# Patient Record
Sex: Male | Born: 1965
Health system: Southern US, Community
[De-identification: ages and names within clinical notes are randomized; demographics above are authoritative.]

## PROBLEM LIST (undated history)

## (undated) DIAGNOSIS — E785 Hyperlipidemia, unspecified: Secondary | ICD-10-CM

## (undated) DIAGNOSIS — I2510 Atherosclerotic heart disease of native coronary artery without angina pectoris: Secondary | ICD-10-CM

## (undated) DIAGNOSIS — I10 Essential (primary) hypertension: Secondary | ICD-10-CM

## (undated) HISTORY — DX: Atherosclerotic heart disease of native coronary artery without angina pectoris: I25.10

## (undated) HISTORY — DX: Hyperlipidemia, unspecified: E78.5

## (undated) HISTORY — DX: Essential (primary) hypertension: I10

---

## 2005-07-28 ENCOUNTER — Encounter: Admission: RE | Admit: 2005-07-28 | Discharge: 2005-07-28 | Payer: Self-pay | Admitting: Internal Medicine

## 2007-09-29 IMAGING — CT CT ANGIO ABDOMEN
1 of 9 series · 13 of 32 positions shown, 19 images · IV contrast (omnipaque)
Comparison: None.

CLINICAL DATA: 39-year-old male, benign refractory renal vascular hypertension despite medications.  
CT ANGIOGRAPHY OF ABDOMEN:
TECHNIQUE: Multidetector CT imaging of the abdomen was performed during bolus injection of intravenous contrast.  Multiplanar CT angiographic image reconstructions were generated to evaluate the vascular anatomy.
Contrast:  125 cc Omnipaque 350

[Series 5: recon 2: renal/sma cta · axial · 0.70mm/px · z∈[-291,-19]mm · 13 of 509 slices shown, 19 images]
[im 37/509  soft-tissue]
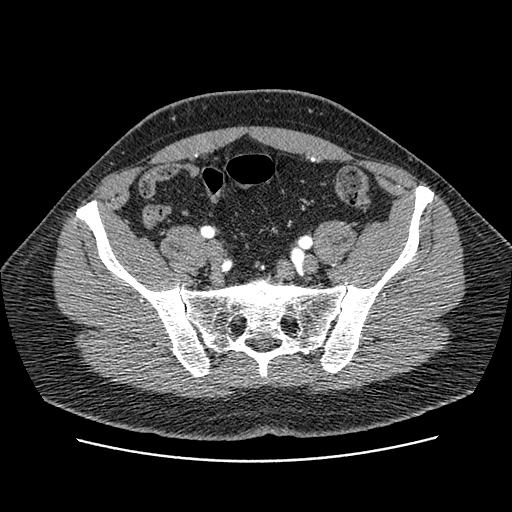
[im 37/509  bone]
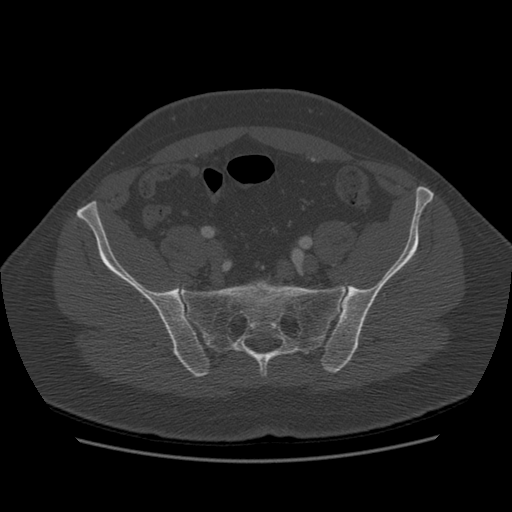
[im 73/509  soft-tissue]
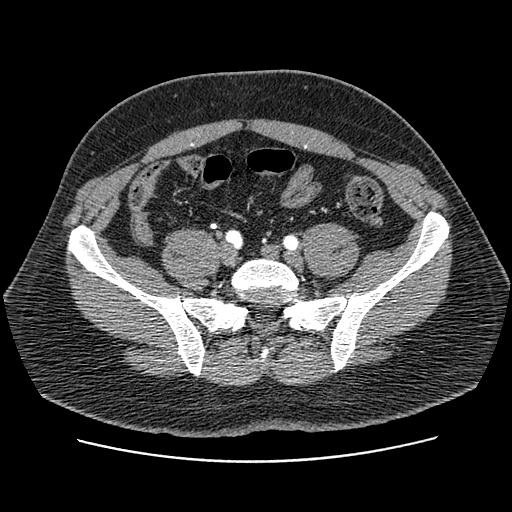
[im 109/509  soft-tissue]
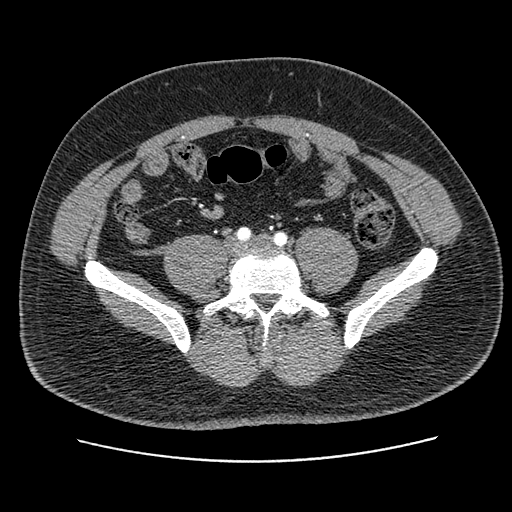
[im 146/509  soft-tissue]
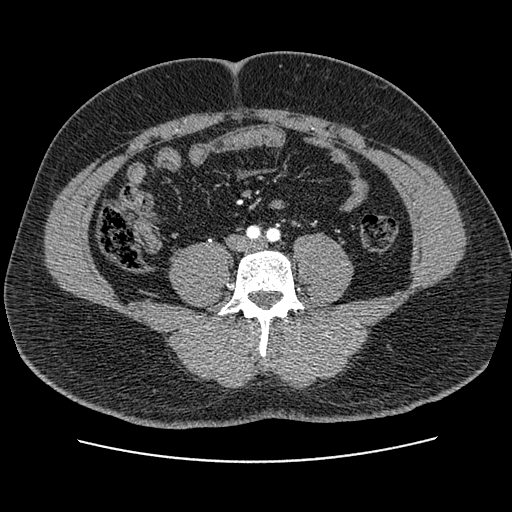
[im 182/509  soft-tissue]
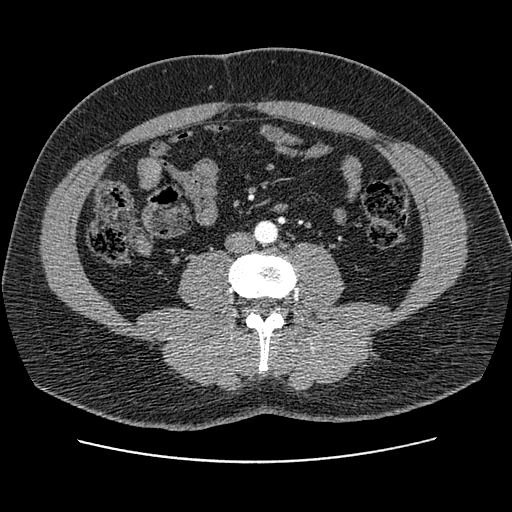
[im 218/509  soft-tissue]
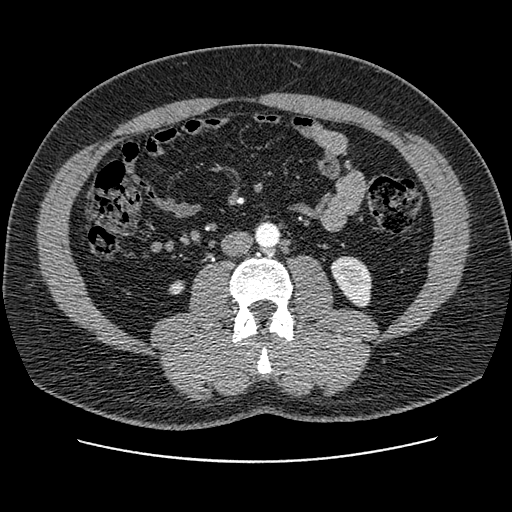
[im 255/509  soft-tissue]
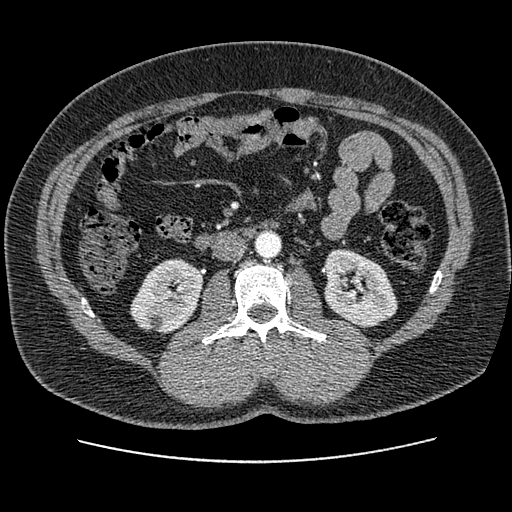
[im 291/509  soft-tissue]
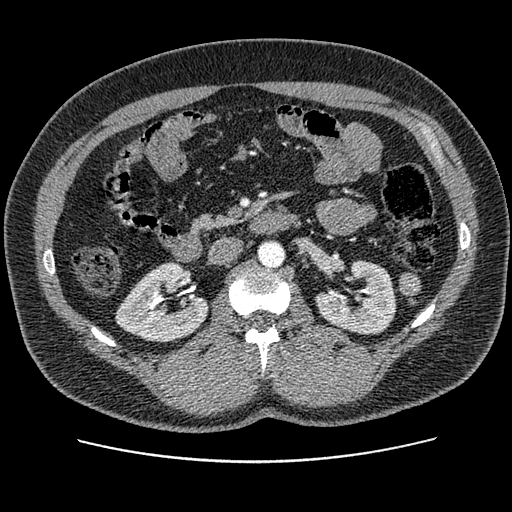
[im 327/509  soft-tissue]
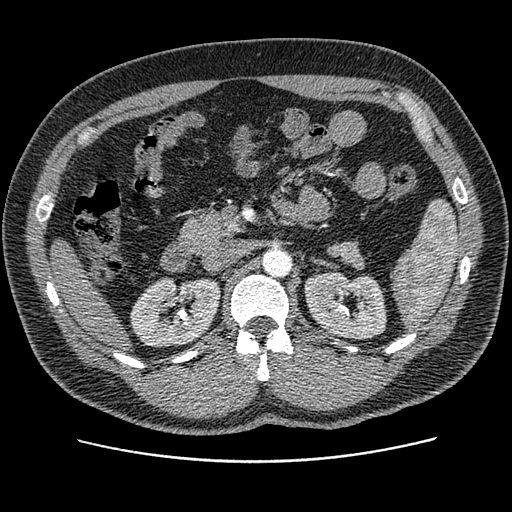
[im 327/509  bone]
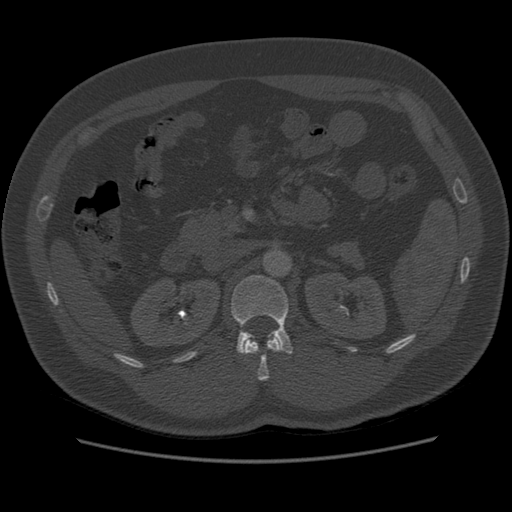
[im 363/509  soft-tissue]
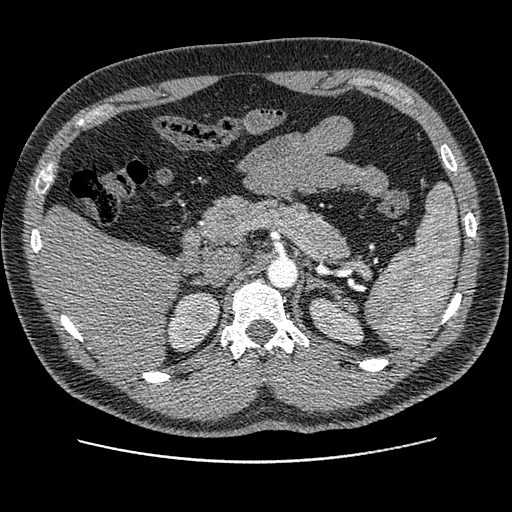
[im 363/509  lung]
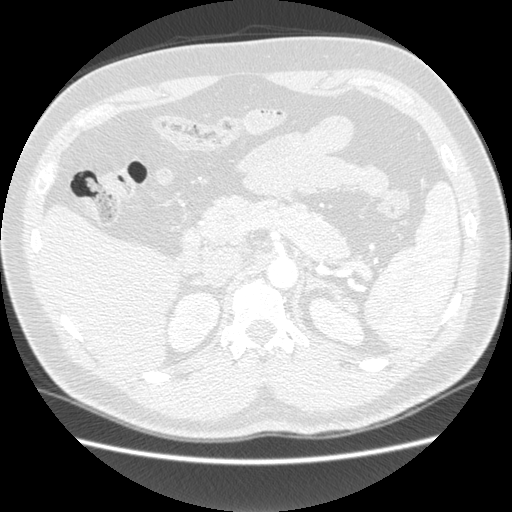
[im 400/509  soft-tissue]
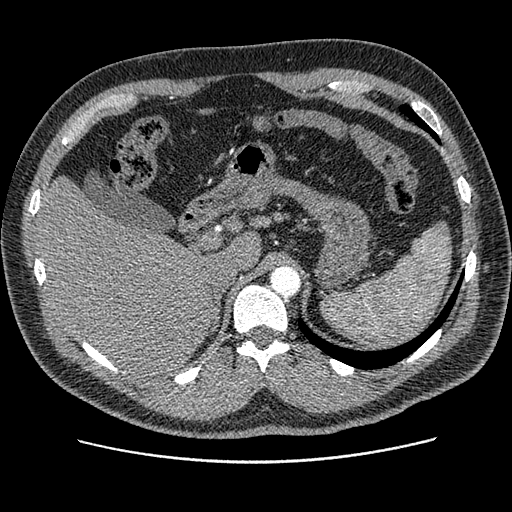
[im 400/509  lung]
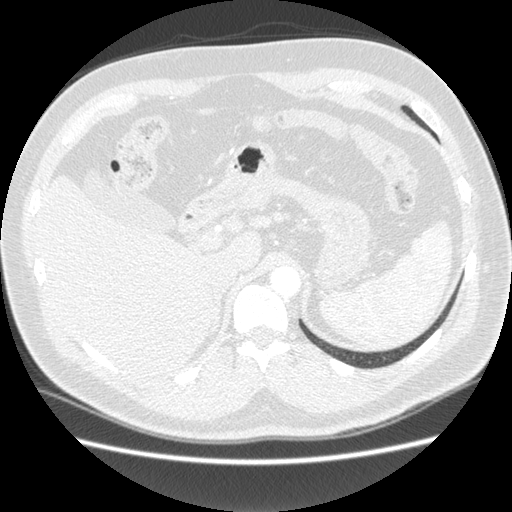
[im 436/509  soft-tissue]
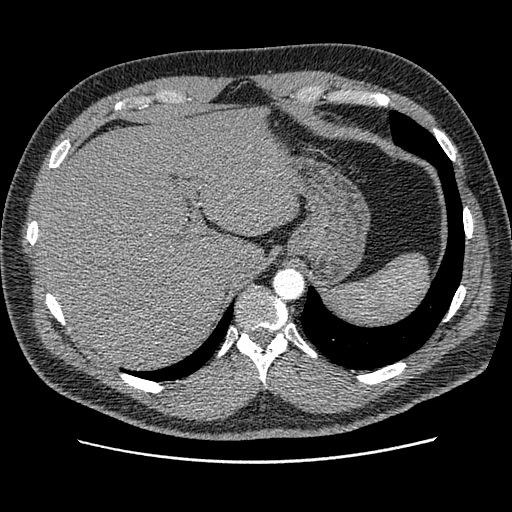
[im 436/509  lung]
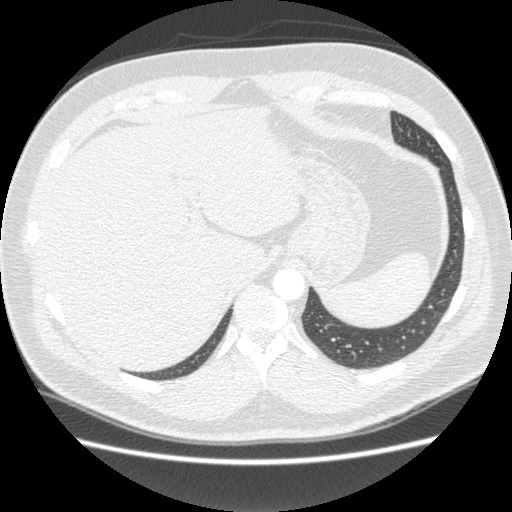
[im 472/509  soft-tissue]
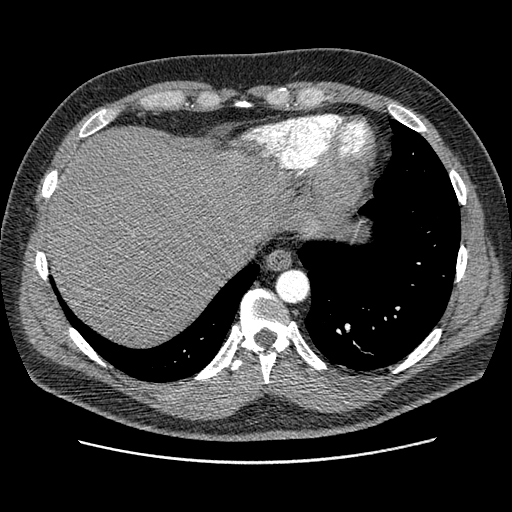
[im 472/509  lung]
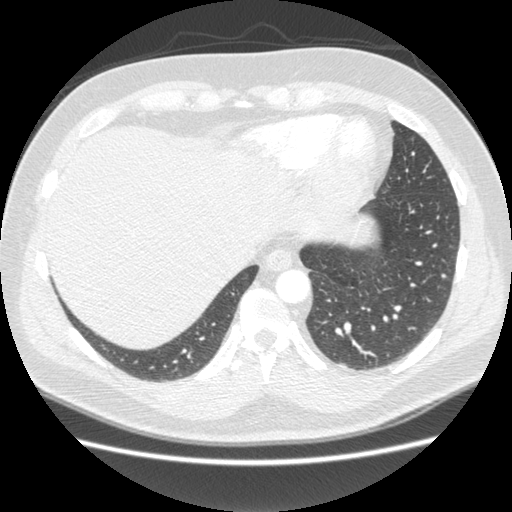

[13 of 32 positions shown; findings below may reference images not displayed]

FINDINGS: Minimal wall irregularity of the aorta without calcification, dissection, aneurysm, or occlusive disease.  There are single widely patent renal arteries.  No evidence of proximal or ostial renal artery stenosis by CTA.  The SMA and IMA are patent.  The visualized jejunal, colic, and ileocolic branches of the SMA are patent throughout the mesentery.  The celiac artery orgin has a normal variant.  The hepatic artery has a separate origin off of the aorta which is patent.  The main celiac axis supplying the splenic and left gastric is also patent.  No visceral vascular disease.  No accessory renal vasculature.
Included axial imaging of the abdomen demonstrates several left upper quadrant perisplenic nodules consistent with accessory splenules (4).  Tiny right renal cysts are evident less than 1 cm in size. 
Kidneys demonstrate no hydronephrosis, acute inflammation, stone disease, or fluid collection.  The kidneys are symmetric in size with preserved cortex.
IMPRESSION: 1.  Widely patent single renal arteries.  No evidence of renal artery stenosis by CTA.  
2.  Patent visceral vasculature. 
3.  No aortoiliac occlusive disease.

## 2010-11-03 ENCOUNTER — Ambulatory Visit (HOSPITAL_COMMUNITY)
Admission: EM | Admit: 2010-11-03 | Discharge: 2010-11-04 | Payer: 59 | Attending: Emergency Medicine | Admitting: Emergency Medicine

## 2010-11-03 DIAGNOSIS — T18108A Unspecified foreign body in esophagus causing other injury, initial encounter: Secondary | ICD-10-CM | POA: Insufficient documentation

## 2010-11-03 DIAGNOSIS — K229 Disease of esophagus, unspecified: Secondary | ICD-10-CM | POA: Insufficient documentation

## 2010-11-03 DIAGNOSIS — IMO0002 Reserved for concepts with insufficient information to code with codable children: Secondary | ICD-10-CM | POA: Insufficient documentation

## 2010-11-04 ENCOUNTER — Other Ambulatory Visit: Payer: Self-pay | Admitting: Gastroenterology

## 2012-08-28 ENCOUNTER — Other Ambulatory Visit: Payer: Self-pay | Admitting: Internal Medicine

## 2012-08-28 DIAGNOSIS — IMO0002 Reserved for concepts with insufficient information to code with codable children: Secondary | ICD-10-CM

## 2012-09-03 ENCOUNTER — Ambulatory Visit
Admission: RE | Admit: 2012-09-03 | Discharge: 2012-09-03 | Disposition: A | Discharge status: Home / Self Care | Payer: 59 | Source: Ambulatory Visit | Attending: Internal Medicine | Admitting: Internal Medicine

## 2012-09-03 DIAGNOSIS — IMO0002 Reserved for concepts with insufficient information to code with codable children: Secondary | ICD-10-CM

## 2013-12-19 ENCOUNTER — Encounter (HOSPITAL_COMMUNITY): Payer: Self-pay

## 2013-12-19 ENCOUNTER — Ambulatory Visit (HOSPITAL_COMMUNITY)
Admission: RE | Admit: 2013-12-19 | Discharge: 2013-12-19 | Disposition: A | Discharge status: Home / Self Care | Payer: 59 | Source: Ambulatory Visit | Attending: Gastroenterology | Admitting: Gastroenterology

## 2013-12-19 ENCOUNTER — Encounter (HOSPITAL_COMMUNITY)
Admission: RE | Disposition: A | Discharge status: Home / Self Care | Payer: Self-pay | Source: Ambulatory Visit | Attending: Gastroenterology

## 2013-12-19 DIAGNOSIS — IMO0002 Reserved for concepts with insufficient information to code with codable children: Secondary | ICD-10-CM | POA: Diagnosis not present

## 2013-12-19 DIAGNOSIS — T18108A Unspecified foreign body in esophagus causing other injury, initial encounter: Secondary | ICD-10-CM | POA: Diagnosis present

## 2013-12-19 DIAGNOSIS — K200 Eosinophilic esophagitis: Secondary | ICD-10-CM | POA: Diagnosis not present

## 2013-12-19 HISTORY — PX: ESOPHAGOGASTRODUODENOSCOPY: SHX5428

## 2013-12-19 SURGERY — EGD (ESOPHAGOGASTRODUODENOSCOPY)
Anesthesia: Moderate Sedation

## 2013-12-19 MED ORDER — MIDAZOLAM HCL 5 MG/ML IJ SOLN
INTRAMUSCULAR | Status: AC
  Filled 2013-12-19: qty 2

## 2013-12-19 MED ORDER — BUTAMBEN-TETRACAINE-BENZOCAINE 2-2-14 % EX AERO
INHALATION_SPRAY | CUTANEOUS | Status: DC | PRN
  Administered 2013-12-19: 2 via TOPICAL

## 2013-12-19 MED ORDER — SODIUM CHLORIDE 0.9 % IV SOLN
INTRAVENOUS | Status: DC
  Administered 2013-12-19: 500 mL via INTRAVENOUS

## 2013-12-19 MED ORDER — FENTANYL CITRATE 0.05 MG/ML IJ SOLN
INTRAMUSCULAR | Status: AC
  Filled 2013-12-19: qty 2

## 2013-12-19 MED ORDER — MIDAZOLAM HCL 10 MG/2ML IJ SOLN
INTRAMUSCULAR | Status: DC | PRN
  Administered 2013-12-19 (×3): 2 mg via INTRAVENOUS

## 2013-12-19 MED ORDER — FENTANYL CITRATE 0.05 MG/ML IJ SOLN
INTRAMUSCULAR | Status: DC | PRN
  Administered 2013-12-19 (×3): 25 ug via INTRAVENOUS

## 2013-12-19 NOTE — Op Note (Signed)
[facility] [address] [place], [id number]   OPERATIVE PROCEDURE REPORT  PATIENT: [name], Frank R.  MR#: [id number] BIRTHDATE: 12-30-1965  GENDER: Male ENDOSCOPIST: Jeani HawkingPatrick Drucella Karbowski, MD ASSISTANT:   Kandice RobinsonsGuillaume Awaka, technician and Priscella MannAutumn Goldsmith, technician PROCEDURE DATE: 12/19/2013 PROCEDURE:   EGD, diagnostic ASA CLASS:   Class II INDICATIONS:Food impaction. MEDICATIONS: Versed 6 mg IV and Fentanyl 75 mcg IV TOPICAL ANESTHETIC:   Cetacaine Spray  DESCRIPTION OF PROCEDURE:   After the risks benefits and alternatives of the procedure were thoroughly explained, informed consent was obtained.  The Pentax Gastroscope [id number]  endoscope was introduced through the mouth  and advanced to the second portion of the duodenum Without limitations.      The instrument was slowly withdrawn as the mucosa was fully examined.      FINDINGS: The esophagus displayed concentric rings consistent with his diagnosis of EoE.  In the distla esophagus the meat bolus was identified.  Using gentle to moderate pressure, the meat bolus was able to be passed into the gastric lumen.  The Z-line was sharp. No other abnormalities noted in the upper GI tract.          The scope was then withdrawn from the patient and the procedure terminated.  COMPLICATIONS: There were no complications.  IMPRESSION: 1) Meat impaction secondary to EoE.  RECOMMENDATIONS: 1) PPI QD indefinitely. 2) Restart Fluticasone inhaler. 3) Try the 6 food elimination diet. 4) Follow up in the office in a month.  _______________________________ eSigned:  Jeani HawkingPatrick Sarkis Rhines, MD 12/19/2013 5:42 PM

## 2013-12-19 NOTE — Discharge Instructions (Signed)
Conscious Sedation, Adult, Care After Refer to this sheet in the next few weeks. These instructions provide you with information on caring for yourself after your procedure. Your health care provider may also give you more specific instructions. Your treatment has been planned according to current medical practices, but problems sometimes occur. Call your health care provider if you have any problems or questions after your procedure. WHAT TO EXPECT AFTER THE PROCEDURE  After your procedure:  You may feel sleepy, clumsy, and have poor balance for several hours.  Vomiting may occur if you eat too soon after the procedure. HOME CARE INSTRUCTIONS  Do not participate in any activities where you could become injured for at least 24 hours. Do not:  Drive.  Swim.  Ride a bicycle.  Operate heavy machinery.  Cook.  Use power tools.  Climb ladders.  Work from a high place.  Do not make important decisions or sign legal documents until you are improved.  If you vomit, drink water, juice, or soup when you can drink without vomiting. Make sure you have little or no nausea before eating solid foods.  Only take over-the-counter or prescription medicines for pain, discomfort, or fever as directed by your health care provider.  Make sure you and your family fully understand everything about the medicines given to you, including what side effects may occur.  You should not drink alcohol, take sleeping pills, or take medicines that cause drowsiness for at least 24 hours.  If you smoke, do not smoke without supervision.  If you are feeling better, you may resume normal activities 24 hours after you were sedated.  Keep all appointments with your health care provider. SEEK MEDICAL CARE IF:  Your skin is pale or bluish in color.  You continue to feel nauseous or vomit.  Your pain is getting worse and is not helped by medicine.  You have bleeding or swelling.  You are still sleepy or  feeling clumsy after 24 hours. SEEK IMMEDIATE MEDICAL CARE IF:  You develop a rash.  You have difficulty breathing.  You develop any type of allergic problem.  You have a fever. MAKE SURE YOU:  Understand these instructions.  Will watch your condition.  Will get help right away if you are not doing well or get worse. Document Released: 02/12/2013 Document Reviewed: 02/12/2013 [facility] Patient Information 2015 [place], [place]. This information is not intended to replace advice given to you by your health care provider. Make sure you discuss any questions you have with your health care provider. [facility] Care After  These instructions give you information on caring for yourself after your procedure. Your doctor may also give you more specific instructions. Call your doctor if you have any problems or questions after your procedure. HOME CARE  You may go back to your normal diet and activities when you feel able.  Drink enough fluids to keep your pee (urine) clear or pale yellow.  If you have a hard time pooping (constipation), ask your doctor if you should take a medicine to help you poop (laxative). GET HELP RIGHT AWAY IF:  You cannot pass gas.  You have a very hard time pooping.  You have belly (abdominal) pain that gets worse.  You have red, itchy spots (hives) on your skin.  Your throat gets puffy (swells).  You have trouble breathing.  You have a fever.  You have a hard time pooping for more than 3 days.  Your poop looks white or chalky  after 3 days.  You have cramps, pain, or watery poop (diarrhea).  You feel sick to your stomach (nauseous), or you throw up (vomit). MAKE SURE YOU:  Understand these instructions.  Will watch your condition.  Will get help right away if you are not doing well or get worse. Document Released: 07/17/2011 Document Reviewed: 07/17/2011 [facility] Patient Information 2015 [place], [place]. This information is  not intended to replace advice given to you by your health care provider. Make sure you discuss any questions you have with your health care provider.

## 2013-12-19 NOTE — H&P (Signed)
  Frank Watson HPI: This is a 48 year old male with a PMH of eosinophilic esophagitis and prior food impaction in 2012.  He presents with recurrent symptoms of a food impaction.  This afternoon, around 12 PM he ate a piece of chicken and it lodged in his esophagus.  He is able to manage his oral secretions to a point.  History reviewed. No pertinent past medical history.  History reviewed. No pertinent past surgical history.  History reviewed. No pertinent family history.  Social History:  reports that he has never smoked. He has never used smokeless tobacco. His alcohol and drug histories are not on file.  Allergies: Not on File  Medications:  Scheduled:  Continuous: . sodium chloride      No results found for this or any previous visit (from the past 24 hour(s)).   No results found.  ROS:  As stated above in the HPI otherwise negative.  Blood pressure 200/108, pulse 75, resp. rate 15, SpO2 98.00%.    PE: Gen: NAD, Alert and Oriented HEENT:  [place]/AT, EOMI Neck: Supple, no LAD Lungs: CTA Bilaterally CV: RRR without M/G/R ABM: Soft, NTND, +BS Ext: No C/C/E  Assessment/Plan: 1) Food impaction. 2) Eosinophilic esophagitis.  Plan: 1) EGD.  Frank Watson D 12/19/2013, 5:04 PM

## 2013-12-22 ENCOUNTER — Encounter (HOSPITAL_COMMUNITY): Payer: Self-pay | Admitting: Gastroenterology

## 2014-11-05 IMAGING — US US SCROTUM
1 series · 14 of 25 positions shown · non-contrast
Comparison: None.

CLINICAL DATA: Left testicular/scrotal mass inferiorly for 2 weeks.
Prior vasectomy.

ULTRASOUND OF SCROTUM
TECHNIQUE: Complete ultrasound examination of the testicles,
epididymis, and other scrotal structures was performed.

[Series 1: us scrotum · 0.06mm/px · 14 of 49 slices shown]
[im 1/49]
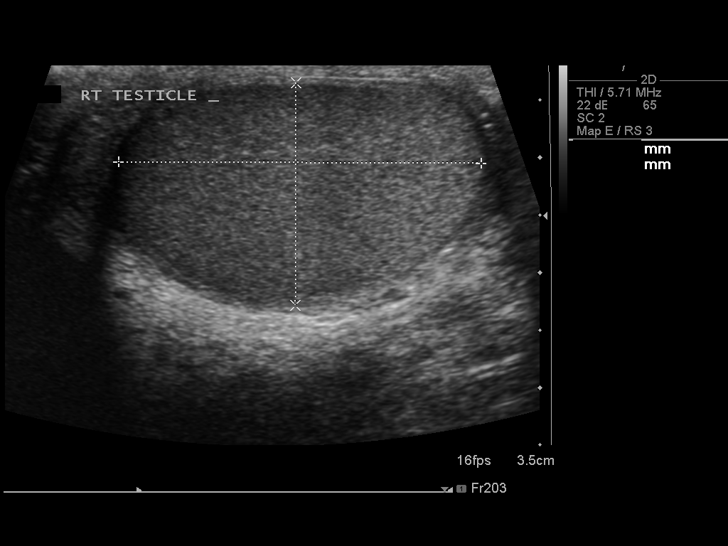
[im 5/49]
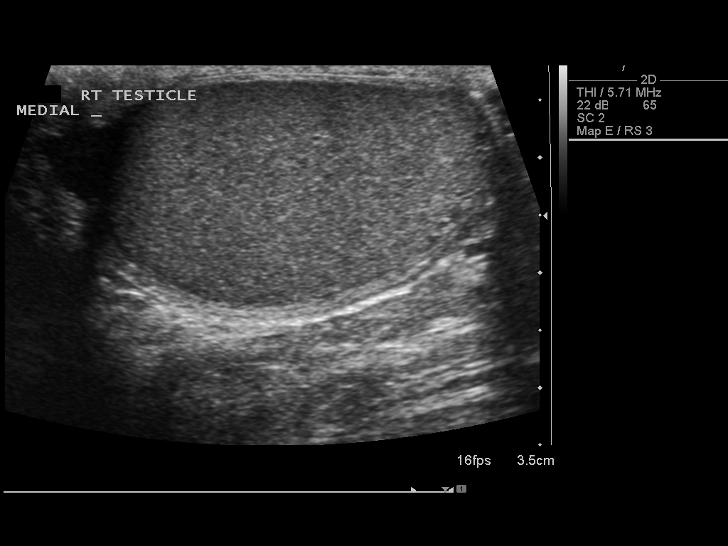
[im 9/49]
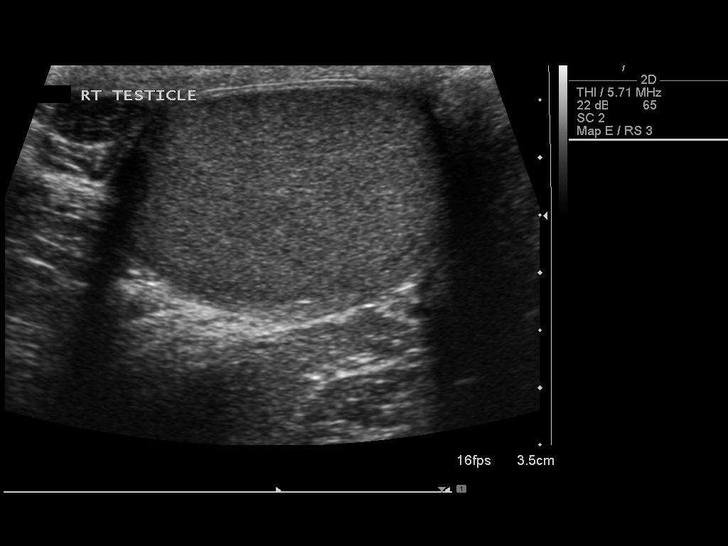
[im 13/49]
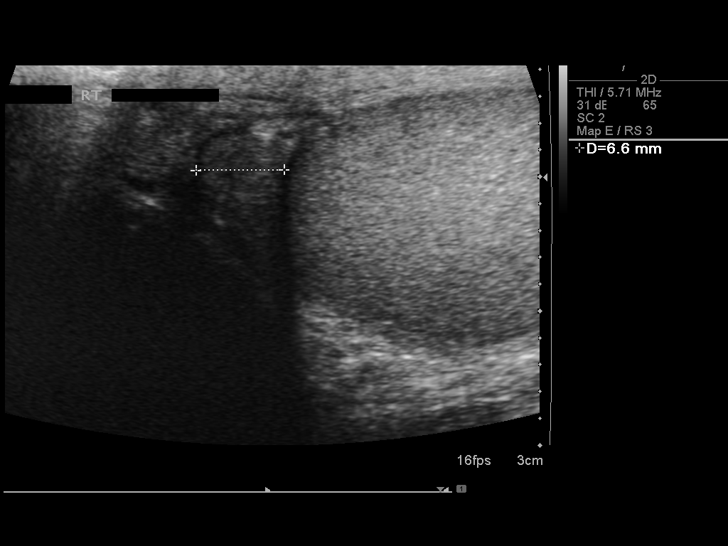
[im 17/49]
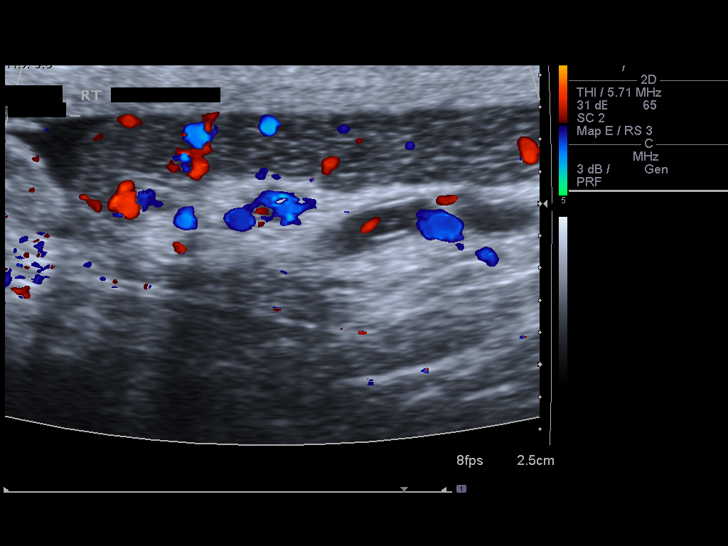
[im 19/49]
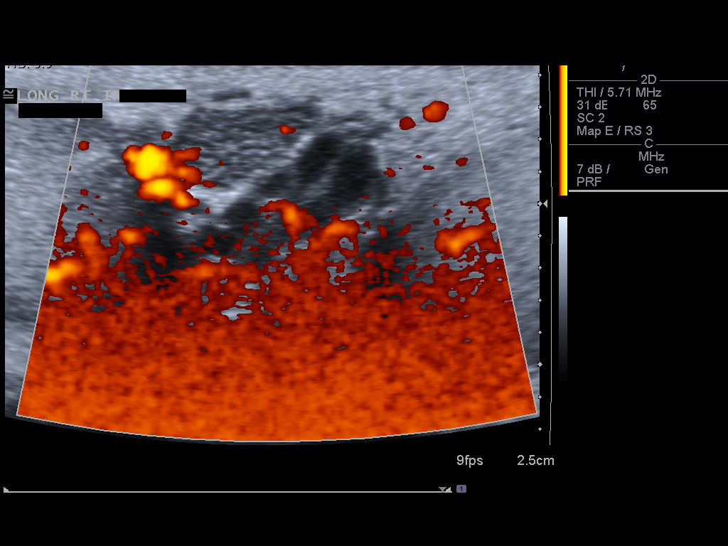
[im 23/49]
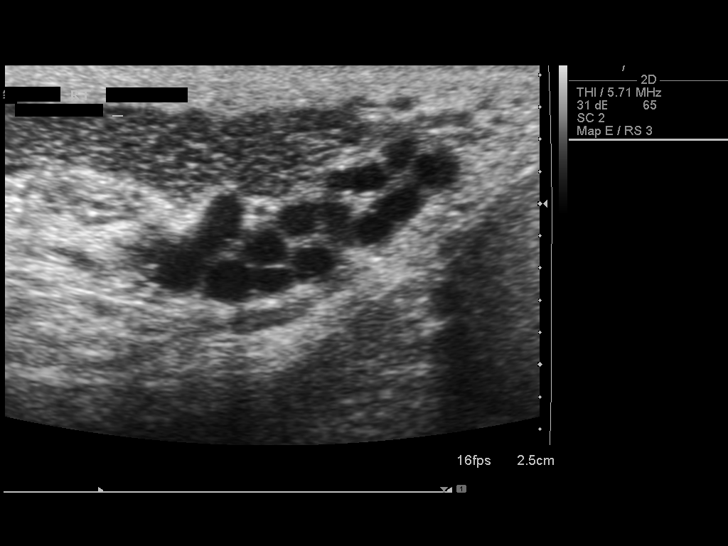
[im 27/49]
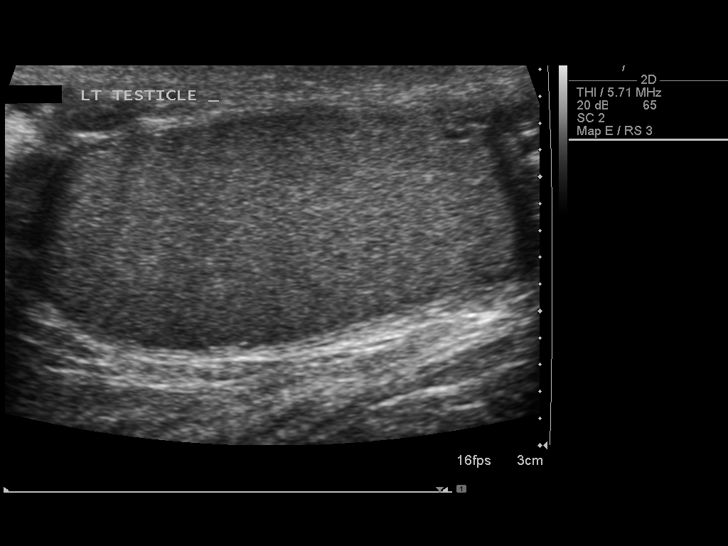
[im 31/49]
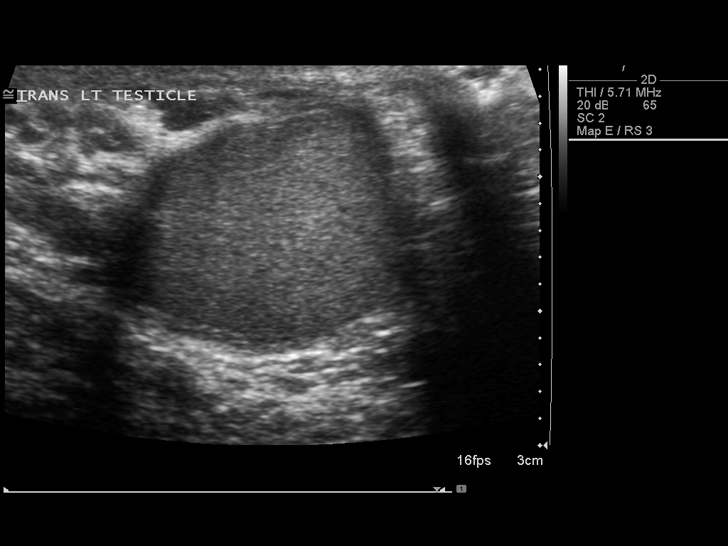
[im 33/49]
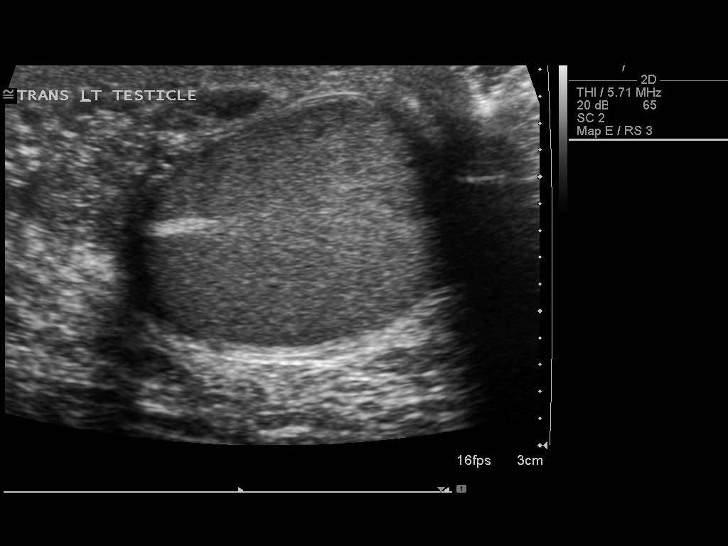
[im 37/49]
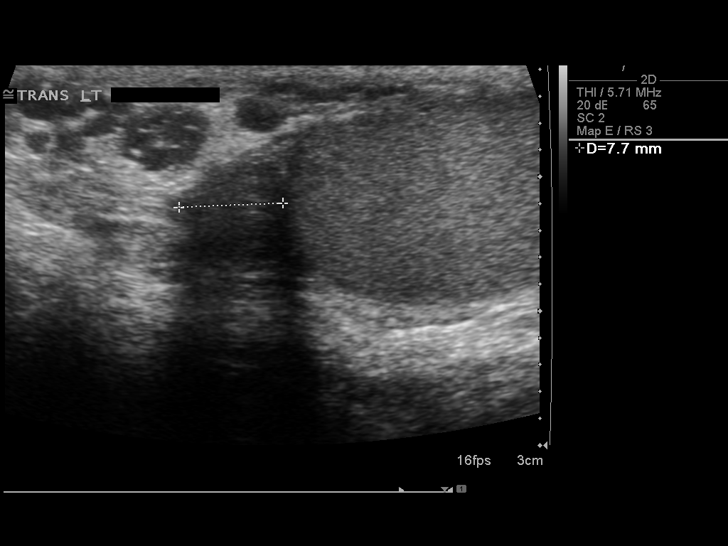
[im 41/49]
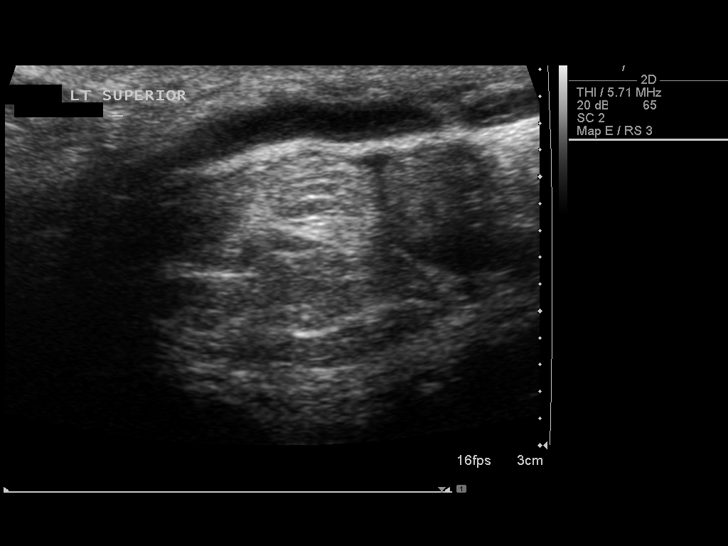
[im 45/49]
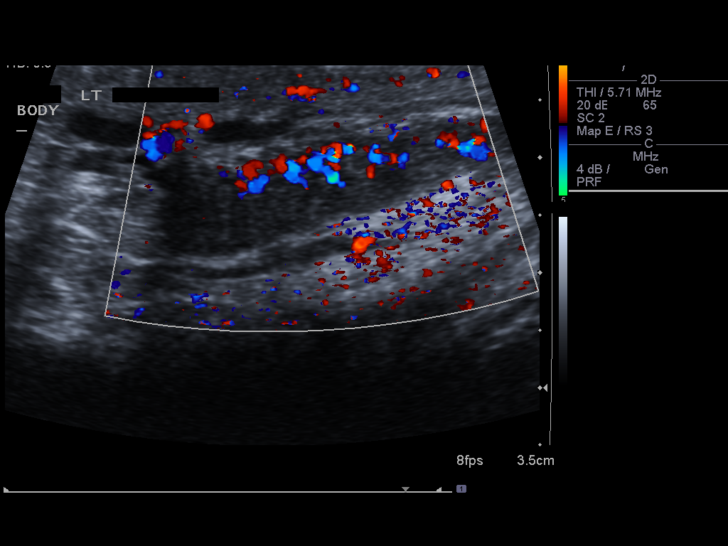
[im 49/49]
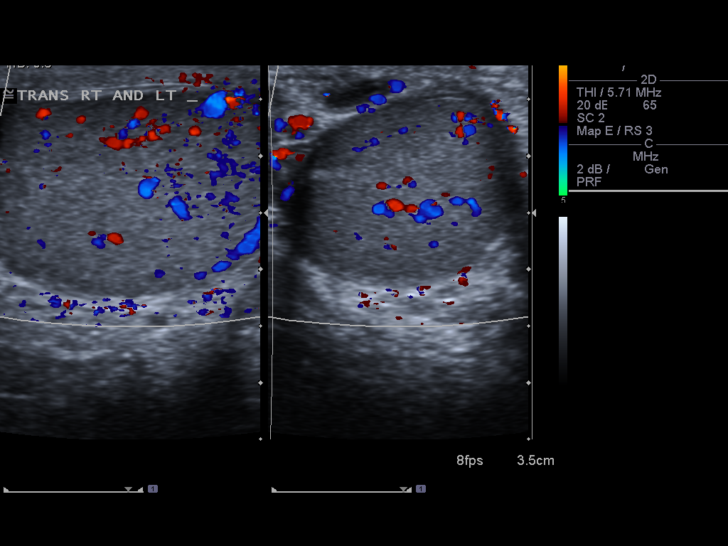

[14 of 25 positions shown; findings below may reference images not displayed]

FINDINGS: Right testis:  3.2 x 2.8 x 1.9 cm.  Normal.

Left testis:  3.7 x 2.3 x 1.7 cm.  Normal.

Right epididymis:  Normal in size and appearance.

Left epididymis:  The tail of the left epididymis is mildly
prominent with increased internal color flow but no measurable mass
lesion.

No intratesticular mass is seen on either side.  The testes are
equal in echogenicity with color flow present bilaterally.

Hydrocele:  Absent

Varicocele:  Small varicoceles are noted bilaterally.
IMPRESSION: No intratesticular mass lesion.  Prominence of the left epididymis
tail with hyperemia, which could represent focal epididymitis.  No
measurable mass lesion.

## 2015-11-24 DIAGNOSIS — I10 Essential (primary) hypertension: Secondary | ICD-10-CM | POA: Diagnosis not present

## 2015-11-24 DIAGNOSIS — R809 Proteinuria, unspecified: Secondary | ICD-10-CM | POA: Diagnosis not present

## 2015-11-24 DIAGNOSIS — Z8249 Family history of ischemic heart disease and other diseases of the circulatory system: Secondary | ICD-10-CM | POA: Diagnosis not present

## 2015-11-24 DIAGNOSIS — E784 Other hyperlipidemia: Secondary | ICD-10-CM | POA: Diagnosis not present

## 2016-01-04 DIAGNOSIS — L237 Allergic contact dermatitis due to plants, except food: Secondary | ICD-10-CM | POA: Diagnosis not present

## 2016-07-24 DIAGNOSIS — M109 Gout, unspecified: Secondary | ICD-10-CM | POA: Diagnosis not present

## 2016-07-24 DIAGNOSIS — I10 Essential (primary) hypertension: Secondary | ICD-10-CM | POA: Diagnosis not present

## 2016-07-24 DIAGNOSIS — E784 Other hyperlipidemia: Secondary | ICD-10-CM | POA: Diagnosis not present

## 2016-07-24 DIAGNOSIS — Z125 Encounter for screening for malignant neoplasm of prostate: Secondary | ICD-10-CM | POA: Diagnosis not present

## 2016-07-31 DIAGNOSIS — Z8249 Family history of ischemic heart disease and other diseases of the circulatory system: Secondary | ICD-10-CM | POA: Diagnosis not present

## 2016-07-31 DIAGNOSIS — Z23 Encounter for immunization: Secondary | ICD-10-CM | POA: Diagnosis not present

## 2016-07-31 DIAGNOSIS — I10 Essential (primary) hypertension: Secondary | ICD-10-CM | POA: Diagnosis not present

## 2016-07-31 DIAGNOSIS — E784 Other hyperlipidemia: Secondary | ICD-10-CM | POA: Diagnosis not present

## 2016-07-31 DIAGNOSIS — Z0000 Encounter for general adult medical examination without abnormal findings: Secondary | ICD-10-CM | POA: Diagnosis not present

## 2016-07-31 DIAGNOSIS — Z1389 Encounter for screening for other disorder: Secondary | ICD-10-CM | POA: Diagnosis not present

## 2016-07-31 DIAGNOSIS — K219 Gastro-esophageal reflux disease without esophagitis: Secondary | ICD-10-CM | POA: Diagnosis not present

## 2017-07-25 DIAGNOSIS — M109 Gout, unspecified: Secondary | ICD-10-CM | POA: Diagnosis not present

## 2017-07-25 DIAGNOSIS — I10 Essential (primary) hypertension: Secondary | ICD-10-CM | POA: Diagnosis not present

## 2017-07-25 DIAGNOSIS — Z0000 Encounter for general adult medical examination without abnormal findings: Secondary | ICD-10-CM | POA: Diagnosis not present

## 2017-07-25 DIAGNOSIS — Z125 Encounter for screening for malignant neoplasm of prostate: Secondary | ICD-10-CM | POA: Diagnosis not present

## 2017-08-01 DIAGNOSIS — Z1389 Encounter for screening for other disorder: Secondary | ICD-10-CM | POA: Diagnosis not present

## 2017-08-01 DIAGNOSIS — M109 Gout, unspecified: Secondary | ICD-10-CM | POA: Diagnosis not present

## 2017-08-01 DIAGNOSIS — E785 Hyperlipidemia, unspecified: Secondary | ICD-10-CM | POA: Diagnosis not present

## 2017-08-01 DIAGNOSIS — Z0000 Encounter for general adult medical examination without abnormal findings: Secondary | ICD-10-CM | POA: Diagnosis not present

## 2017-08-01 DIAGNOSIS — Z8249 Family history of ischemic heart disease and other diseases of the circulatory system: Secondary | ICD-10-CM | POA: Diagnosis not present

## 2017-08-01 DIAGNOSIS — I10 Essential (primary) hypertension: Secondary | ICD-10-CM | POA: Diagnosis not present

## 2018-01-16 DIAGNOSIS — Z1211 Encounter for screening for malignant neoplasm of colon: Secondary | ICD-10-CM | POA: Diagnosis not present

## 2018-01-16 DIAGNOSIS — D721 Eosinophilia: Secondary | ICD-10-CM | POA: Diagnosis not present

## 2018-01-29 DIAGNOSIS — D105 Benign neoplasm of other parts of oropharynx: Secondary | ICD-10-CM | POA: Diagnosis not present

## 2018-01-29 DIAGNOSIS — J343 Hypertrophy of nasal turbinates: Secondary | ICD-10-CM | POA: Diagnosis not present

## 2018-01-29 DIAGNOSIS — Z7289 Other problems related to lifestyle: Secondary | ICD-10-CM | POA: Diagnosis not present

## 2018-01-29 DIAGNOSIS — K200 Eosinophilic esophagitis: Secondary | ICD-10-CM | POA: Diagnosis not present

## 2018-03-20 DIAGNOSIS — K200 Eosinophilic esophagitis: Secondary | ICD-10-CM | POA: Diagnosis not present

## 2018-03-20 DIAGNOSIS — Z1211 Encounter for screening for malignant neoplasm of colon: Secondary | ICD-10-CM | POA: Diagnosis not present

## 2018-06-28 DIAGNOSIS — R1310 Dysphagia, unspecified: Secondary | ICD-10-CM | POA: Diagnosis not present

## 2018-08-01 DIAGNOSIS — M109 Gout, unspecified: Secondary | ICD-10-CM | POA: Diagnosis not present

## 2018-08-01 DIAGNOSIS — Z0000 Encounter for general adult medical examination without abnormal findings: Secondary | ICD-10-CM | POA: Diagnosis not present

## 2018-08-01 DIAGNOSIS — I10 Essential (primary) hypertension: Secondary | ICD-10-CM | POA: Diagnosis not present

## 2018-08-01 DIAGNOSIS — E7849 Other hyperlipidemia: Secondary | ICD-10-CM | POA: Diagnosis not present

## 2018-08-01 DIAGNOSIS — Z125 Encounter for screening for malignant neoplasm of prostate: Secondary | ICD-10-CM | POA: Diagnosis not present

## 2018-08-08 DIAGNOSIS — Z1331 Encounter for screening for depression: Secondary | ICD-10-CM | POA: Diagnosis not present

## 2018-08-08 DIAGNOSIS — E785 Hyperlipidemia, unspecified: Secondary | ICD-10-CM | POA: Diagnosis not present

## 2018-08-08 DIAGNOSIS — K219 Gastro-esophageal reflux disease without esophagitis: Secondary | ICD-10-CM | POA: Diagnosis not present

## 2018-08-08 DIAGNOSIS — Z8249 Family history of ischemic heart disease and other diseases of the circulatory system: Secondary | ICD-10-CM | POA: Diagnosis not present

## 2018-08-08 DIAGNOSIS — I10 Essential (primary) hypertension: Secondary | ICD-10-CM | POA: Diagnosis not present

## 2018-08-08 DIAGNOSIS — Z0000 Encounter for general adult medical examination without abnormal findings: Secondary | ICD-10-CM | POA: Diagnosis not present

## 2019-03-19 DIAGNOSIS — Z20828 Contact with and (suspected) exposure to other viral communicable diseases: Secondary | ICD-10-CM | POA: Diagnosis not present

## 2019-03-19 DIAGNOSIS — J3489 Other specified disorders of nose and nasal sinuses: Secondary | ICD-10-CM | POA: Diagnosis not present

## 2019-03-31 DIAGNOSIS — J3489 Other specified disorders of nose and nasal sinuses: Secondary | ICD-10-CM | POA: Diagnosis not present

## 2019-03-31 DIAGNOSIS — Z20828 Contact with and (suspected) exposure to other viral communicable diseases: Secondary | ICD-10-CM | POA: Diagnosis not present

## 2019-06-02 DIAGNOSIS — R05 Cough: Secondary | ICD-10-CM | POA: Diagnosis not present

## 2019-06-02 DIAGNOSIS — Z20828 Contact with and (suspected) exposure to other viral communicable diseases: Secondary | ICD-10-CM | POA: Diagnosis not present

## 2019-06-02 DIAGNOSIS — J3489 Other specified disorders of nose and nasal sinuses: Secondary | ICD-10-CM | POA: Diagnosis not present

## 2019-08-04 DIAGNOSIS — Z125 Encounter for screening for malignant neoplasm of prostate: Secondary | ICD-10-CM | POA: Diagnosis not present

## 2019-08-04 DIAGNOSIS — Z0000 Encounter for general adult medical examination without abnormal findings: Secondary | ICD-10-CM | POA: Diagnosis not present

## 2019-08-04 DIAGNOSIS — I10 Essential (primary) hypertension: Secondary | ICD-10-CM | POA: Diagnosis not present

## 2019-08-04 DIAGNOSIS — E7849 Other hyperlipidemia: Secondary | ICD-10-CM | POA: Diagnosis not present

## 2019-08-04 DIAGNOSIS — M109 Gout, unspecified: Secondary | ICD-10-CM | POA: Diagnosis not present

## 2019-08-11 DIAGNOSIS — R82998 Other abnormal findings in urine: Secondary | ICD-10-CM | POA: Diagnosis not present

## 2019-08-11 DIAGNOSIS — I10 Essential (primary) hypertension: Secondary | ICD-10-CM | POA: Diagnosis not present

## 2019-08-12 DIAGNOSIS — Z1212 Encounter for screening for malignant neoplasm of rectum: Secondary | ICD-10-CM | POA: Diagnosis not present

## 2019-09-10 DIAGNOSIS — E785 Hyperlipidemia, unspecified: Secondary | ICD-10-CM | POA: Diagnosis not present

## 2019-09-10 DIAGNOSIS — I10 Essential (primary) hypertension: Secondary | ICD-10-CM | POA: Diagnosis not present

## 2019-09-10 DIAGNOSIS — Z8249 Family history of ischemic heart disease and other diseases of the circulatory system: Secondary | ICD-10-CM | POA: Diagnosis not present

## 2019-09-10 DIAGNOSIS — K219 Gastro-esophageal reflux disease without esophagitis: Secondary | ICD-10-CM | POA: Diagnosis not present

## 2019-09-10 DIAGNOSIS — Z0000 Encounter for general adult medical examination without abnormal findings: Secondary | ICD-10-CM | POA: Diagnosis not present

## 2019-09-10 DIAGNOSIS — Z1331 Encounter for screening for depression: Secondary | ICD-10-CM | POA: Diagnosis not present

## 2019-09-29 ENCOUNTER — Ambulatory Visit (INDEPENDENT_AMBULATORY_CARE_PROVIDER_SITE_OTHER): Payer: BC Managed Care – PPO | Admitting: Neurology

## 2019-09-29 ENCOUNTER — Other Ambulatory Visit: Payer: Self-pay

## 2019-09-29 ENCOUNTER — Encounter: Payer: Self-pay | Admitting: Neurology

## 2019-09-29 VITALS — BP 168/95 | HR 61 | Ht 67.0 in | Wt 200.0 lb

## 2019-09-29 DIAGNOSIS — R0683 Snoring: Secondary | ICD-10-CM

## 2019-09-29 DIAGNOSIS — R030 Elevated blood-pressure reading, without diagnosis of hypertension: Secondary | ICD-10-CM

## 2019-09-29 DIAGNOSIS — E669 Obesity, unspecified: Secondary | ICD-10-CM

## 2019-09-29 DIAGNOSIS — R351 Nocturia: Secondary | ICD-10-CM | POA: Diagnosis not present

## 2019-09-29 NOTE — Patient Instructions (Signed)

## 2019-09-29 NOTE — Progress Notes (Signed)
Subjective:    Patient ID: Frank Watson is a 54 y.o. male.  HPI     [name], MD, [name] [facility] [address] [place], [place] [address]  Dear Dr. Dagmar Hait,   I saw your patient, Frank Watson, upon your kind request in my sleep clinic today for initial consultation of his sleep disorder, in particular, concern for underlying obstructive sleep apnea.  The patient is unaccompanied today.  As you know, Frank Watson is a 54 year old right-handed gentleman with an underlying medical history of reflux disease, hypertension, and mild obesity, who reports snoring which has been disturbing his wife.  He has had some daytime tiredness and sleep disruption of the past few years.  His bedtime is generally around 10 or 11, sometimes earlier and sometimes a little later.  Rise time is around 7.  He has nocturia about once per average night, denies any recurrent morning headaches, is not aware of any family history of OSA.  I reviewed the office note from 09/10/2019.  He was recently started on a different blood pressure medication combination due to elevated blood pressure values.  His Epworth sleepiness score is 3 out of 24, fatigue severity score is 23 out of 63.  He lives with his wife, they also have a 25-month-old grandson staying with them.  The patient has 1 grandson.  He works for [employer].  He is a non-smoker, drinks alcohol occasionally and caffeine and limitation in the form of half-and-half ice tea.  They do not have any pets in the household and grandson typically sleeps in his own bedroom.  His Past Medical History Is Significant For: No past medical history on file.  His Past Surgical History Is Significant For: Past Surgical History:  Procedure Laterality Date  . ESOPHAGOGASTRODUODENOSCOPY N/A 12/19/2013   Procedure: ESOPHAGOGASTRODUODENOSCOPY (EGD);  Surgeon: Beryle Beams, MD;  Location: [facility] ENDOSCOPY;  Service: Endoscopy;  Laterality:  N/A;    His Family History Is Significant For: No family history on file.  His Social History Is Significant For: Social History   Socioeconomic History  . Marital status: Married    Spouse name: Not on file  . Number of children: Not on file  . Years of education: Not on file  . Highest education level: Not on file  Occupational History  . Not on file  Tobacco Use  . Smoking status: Never Smoker  . Smokeless tobacco: Never Used  Substance and Sexual Activity  . Alcohol use: Not on file  . Drug use: Not on file  . Sexual activity: Not on file  Other Topics Concern  . Not on file  Social History Narrative  . Not on file   Social Determinants of Health   Financial Resource Strain:   . Difficulty of Paying Living Expenses:   Food Insecurity:   . Worried About Charity fundraiser in the Last Year:   . Arboriculturist in the Last Year:   Transportation Needs:   . Film/video editor (Medical):   Marland Kitchen Lack of Transportation (Non-Medical):   Physical Activity:   . Days of Exercise per Week:   . Minutes of Exercise per Session:   Stress:   . Feeling of Stress :   Social Connections:   . Frequency of Communication with Friends and Family:   . Frequency of Social Gatherings with Friends and Family:   . Attends Religious Services:   . Active Member of Clubs  or Organizations:   . Attends Banker Meetings:   Marland Kitchen Marital Status:     His Allergies Are:  Not on File:   His Current Medications Are:  Outpatient Encounter Medications as of 09/29/2019  Medication Sig  . esomeprazole (NEXIUM) 40 MG capsule TAKE 1 CAPSULE BY MOUTH EVERY DAY  . KLOR-CON M20 20 MEQ tablet   . Olmesartan-amLODIPine-HCTZ 40-10-12.5 MG TABS Take 1 tablet by mouth daily.   No facility-administered encounter medications on file as of 09/29/2019.  :  Review of Systems:  Out of a complete 14 point review of systems, all are reviewed and negative with the exception of these symptoms as  listed below: Review of Systems  Neurological:       Here for sleep consult. No prior sleep study. Snoring and occasional daytime sleepiness/fatigue is present.  Epworth Sleepiness Scale 0= would never doze 1= slight chance of dozing 2= moderate chance of dozing 3= high chance of dozing  Sitting and reading:0 Watching TV:1 Sitting inactive in a public place (ex. Theater or meeting):0 As a passenger in a car for an hour without a break:0 Lying down to rest in the afternoon:2 Sitting and talking to someone:0 Sitting quietly after lunch (no alcohol):0 In a car, while stopped in traffic:0 Total:3     Objective:  Neurological Exam  Physical Exam Physical Examination:   Vitals:   09/29/19 0753  BP: (!) 168/95  Pulse: 61    General Examination: The patient is a very pleasant 54 y.o. male in no acute distress. He appears well-developed and well-nourished and well groomed.   HEENT: Normocephalic, atraumatic, pupils are equal, round and reactive to light, extraocular tracking is good without limitation to gaze excursion or nystagmus noted. Hearing is grossly intact. Face is symmetric with normal facial animation. Speech is clear with no dysarthria noted. There is no hypophonia. There is no lip, neck/head, jaw or voice tremor. Neck is supple with full range of passive and active motion. There are no carotid bruits on auscultation. Oropharynx exam reveals: mild mouth dryness, good dental hygiene and moderate airway crowding, due to smaller airway entry, larger uvula, tonsils in place, Mallampati class III.  Tonsils are about 1-2+ in size, tongue protrudes centrally in palate elevates symmetrically.  Neck circumference of 17-7/8 inches.  Chest: Clear to auscultation without wheezing, rhonchi or crackles noted.  Heart: S1+S2+0, regular and normal without murmurs, rubs or gallops noted.   Abdomen: Soft, non-tender and non-distended with normal bowel sounds appreciated on  auscultation.  Extremities: There is no pitting edema in the distal lower extremities bilaterally.   Skin: Warm and dry without trophic changes noted.   Musculoskeletal: exam reveals no obvious joint deformities, tenderness or joint swelling or erythema.   Neurologically:  Mental status: The patient is awake, alert and oriented in all 4 spheres. His immediate and remote memory, attention, language skills and fund of knowledge are appropriate. There is no evidence of aphasia, agnosia, apraxia or anomia. Speech is clear with normal prosody and enunciation. Thought process is linear. Mood is normal and affect is normal.  Cranial nerves II - XII are as described above under HEENT exam.  Motor exam: Normal bulk, strength and tone is noted. There is no tremor, Romberg is negative. Fine motor skills and coordination: grossly intact.  Cerebellar testing: No dysmetria or intention tremor. There is no truncal or gait ataxia.  Sensory exam: intact to light touch in the upper and lower extremities.  Gait, station and balance:  He stands easily. No veering to one side is noted. No leaning to one side is noted. Posture is age-appropriate and stance is narrow based. Gait shows normal stride length and normal pace. No problems turning are noted. Tandem walk is unremarkable.                Assessment and Plan:   In summary, Frank Watson is a very pleasant 54 y.o.-year old male with an underlying medical history of reflux disease, hypertension, and mild obesity, whose history and physical exam are concerning for obstructive sleep apnea (OSA). I had a long chat with the patient about my findings and the diagnosis of OSA, its prognosis and treatment options. We talked about medical treatments, surgical interventions and non-pharmacological approaches. I explained in particular the risks and ramifications of untreated moderate to severe OSA, especially with respect to developing cardiovascular disease down the Road,  including congestive heart failure, difficult to treat hypertension, cardiac arrhythmias, or stroke. Even type 2 diabetes has, in part, been linked to untreated OSA. Symptoms of untreated OSA include daytime sleepiness, memory problems, mood irritability and mood disorder such as depression and anxiety, lack of energy, as well as recurrent headaches, especially morning headaches. We talked about trying to maintain a healthy lifestyle in general, as well as the importance of weight control. We also talked about the importance of good sleep hygiene. I recommended the following at this time: sleep study.  I explained the sleep test procedure to the patient and also outlined possible surgical and non-surgical treatment options of OSA, including the use of a custom-made dental device (which would require a referral to a specialist dentist or oral surgeon), upper airway surgical options (which would involve a referral to an ENT surgeon). I also explained the CPAP treatment option to the patient, who indicated that he would be willing to try CPAP if the need arises.  I answered all his questions today and the patient was in agreement. I plan to see him back after the sleep study is completed and encouraged him to call with any interim questions, concerns, problems or updates.   Thank you very much for allowing me to participate in the care of this nice patient. If I can be of any further assistance to you please do not hesitate to call me at [phone].  Sincerely,   Huston Foley, MD, [name]

## 2019-11-12 DIAGNOSIS — E876 Hypokalemia: Secondary | ICD-10-CM | POA: Diagnosis not present

## 2019-11-12 DIAGNOSIS — R0683 Snoring: Secondary | ICD-10-CM | POA: Diagnosis not present

## 2019-11-12 DIAGNOSIS — I10 Essential (primary) hypertension: Secondary | ICD-10-CM | POA: Diagnosis not present

## 2019-11-26 ENCOUNTER — Ambulatory Visit (INDEPENDENT_AMBULATORY_CARE_PROVIDER_SITE_OTHER): Payer: BC Managed Care – PPO | Admitting: Neurology

## 2019-11-26 DIAGNOSIS — R030 Elevated blood-pressure reading, without diagnosis of hypertension: Secondary | ICD-10-CM

## 2019-11-26 DIAGNOSIS — R0683 Snoring: Secondary | ICD-10-CM

## 2019-11-26 DIAGNOSIS — G4733 Obstructive sleep apnea (adult) (pediatric): Secondary | ICD-10-CM

## 2019-11-26 DIAGNOSIS — R351 Nocturia: Secondary | ICD-10-CM

## 2019-11-26 DIAGNOSIS — E669 Obesity, unspecified: Secondary | ICD-10-CM

## 2019-11-28 NOTE — Procedures (Signed)
Patient Information     First Name: Frank Last Name: Watson ID: [id number]  Birth Date: 06/04/2065 Age: 54 Gender: Male  Referring Provider: Chilton Greathouse, MD BMI: 31.5 (W=200 lb, H=5' 7'')  Neck Circ.:  17 '' Epworth:  3/24   Sleep Study Information    Study Date: 11/26/19 S/H/A Version: [id number] / 4.1.1528 / 44  History:    54 year old man with a history of reflux disease, hypertension, and mild obesity, who reports snoring, daytime tiredness and sleep disruption.  Summary & Diagnosis:    Severe OSA Recommendations:     This home sleep test demonstrates severe obstructive sleep apnea with a total AHI of 74.9/hour and O2 nadir of 81%. Treatment with positive airway pressure (in the form of CPAP) is recommended. This will require a full night CPAP titration study for proper treatment settings, O2 monitoring and mask fitting. Based on the severity of the sleep disordered breathing an attended titration study is indicated. However, patient's insurance has denied an attended sleep study; therefore, the patient will be advised to proceed with an autoPAP titration/trial at home for now. Please note that untreated obstructive sleep apnea may carry additional perioperative morbidity. Patients with significant obstructive sleep apnea should receive perioperative PAP therapy and the surgeons and particularly the anesthesiologist should be informed of the diagnosis and the severity of the sleep disordered breathing. The patient should be cautioned not to drive, work at heights, or operate dangerous or heavy equipment when tired or sleepy. Review and reiteration of good sleep hygiene measures should be pursued with any patient. Other causes of the patient's symptoms, including circadian rhythm disturbances, an underlying mood disorder, medication effect and/or an underlying medical problem cannot be ruled out based on this test. Clinical correlation is recommended. The patient and his referring provider  will be notified of the test results. The patient will be seen in follow up in sleep clinic at [facility].  I certify that I have reviewed the raw data recording prior to the issuance of this report in accordance with the standards of the American Academy of Sleep Medicine (AASM).  Huston Foley, MD, [name] [facility]) Diplomat, ABPN (Neurology and Sleep)                 Start Study Time: End Study Time: Total Recording Time:  9:56:45 PM 6:00:00 AM 8 h, 3 min  Total Sleep Time % REM of Sleep Time:  7 h, 12 min  25.4    Mean: 93 Minimum: 81 Maximum: 98  Mean of Desaturations Nadirs (%):   90  Oxygen Desaturation. %:   4-9 10-20 >20 Total  Events Number Total   388  22 94.6 5.4  0 0.0  410 100.0  Oxygen Saturation: <90 <=88 <85 <80 <70  Duration (minutes): Sleep % 11.4 2.6  5.8 0.8  1.3 0.2 0.0 0.0 0.0 0.0     Respiratory Indices       Total Events REM NREM All Night  pRDI: pAHI: ODI:  543  536  410 62.3 60.1 41.9 80.5 79.9 62.5 75.9 74.9 57.3       Pulse Rate Statistics during Sleep (BPM)      Mean: 59 Minimum: 35 Maximum: 107    Oxygen Saturation Statistics   Sleep Summary   Indices are calculated using technically valid sleep time of 7 h, 9 min. pRDI/pAHI are calculated using oxi desaturations ? 3%  Sleep Stages Chart                                                                             pAHI=74.9                                                                                               Mild              Moderate                    Severe                                                    5              15                    30

## 2019-11-28 NOTE — Progress Notes (Signed)
Patient referred by Dr. Felipa Eth, seen by me on 09/29/19, HST on 11/26/19.    Please call and notify the patient that the recent home sleep test showed obstructive sleep apnea in the severe range. While I recommend treatment for this in the form CPAP, his insurance will not approve a sleep study for this. They will likely only approve a trial of autoPAP, which means, that we don't have to bring him in for a sleep study with CPAP, but will let him start using a so called autoPAP machine at home, through a DME company (of his choice, or as per insurance requirement). The DME representative will educate him on how to use the machine, how to put the mask on, etc. I have placed an order in the chart. Please send referral, talk to patient, send report to referring MD. We will need a FU in sleep clinic for 10 weeks post-PAP set up, please arrange that with me or one of our NPs. Thanks,   Huston Foley, MD, [name] [facility])

## 2019-11-28 NOTE — Addendum Note (Signed)
Addended by: Huston Foley on: 11/28/2019 11:11 AM   Modules accepted: Orders

## 2019-12-01 ENCOUNTER — Telehealth: Payer: Self-pay

## 2019-12-01 NOTE — Telephone Encounter (Signed)
-----   Message from Huston Foley, MD sent at 11/28/2019 11:11 AM EDT ----- Patient referred by Dr. Felipa Eth, seen by me on 09/29/19, HST on 11/26/19.    Please call and notify the patient that the recent home sleep test showed obstructive sleep apnea in the severe range. While I recommend treatment for this in the form CPAP, his insurance will not approve a sleep study for this. They will likely only approve a trial of autoPAP, which means, that we don't have to bring him in for a sleep study with CPAP, but will let him start using a so called autoPAP machine at home, through a DME company (of his choice, or as per insurance requirement). The DME representative will educate him on how to use the machine, how to put the mask on, etc. I have placed an order in the chart. Please send referral, talk to patient, send report to referring MD. We will need a FU in sleep clinic for 10 weeks post-PAP set up, please arrange that with me or one of our NPs. Thanks,   Huston Foley, MD, [name] [facility])

## 2019-12-01 NOTE — Telephone Encounter (Signed)
I called pt. I advised pt that Dr. Frances Furbish reviewed their sleep study results and found that pt had severe osa. Dr. Frances Furbish recommends that pt start autopap treatment in the home. I reviewed PAP compliance expectations with the pt. Pt is agreeable to starting an auto-PAP. I advised pt that an order will be sent to a DME, [facility], and [facility] will call the pt within about one week after they file with the pt's insurance. [facility] will show the pt how to use the machine, fit for masks, and troubleshoot the auto-PAP if needed.i will check back with the pt in the next week or so to see when he has started machine so we can schedule his 31-90 day f/u. Pt verbalized understanding of results. Pt had no questions at this time but was encouraged to call back if questions arise. I have sent the order to [facility] and have received confirmation that they have received the order.

## 2019-12-15 DIAGNOSIS — G4733 Obstructive sleep apnea (adult) (pediatric): Secondary | ICD-10-CM | POA: Diagnosis not present

## 2020-01-15 DIAGNOSIS — G4733 Obstructive sleep apnea (adult) (pediatric): Secondary | ICD-10-CM | POA: Diagnosis not present

## 2020-02-14 DIAGNOSIS — G4733 Obstructive sleep apnea (adult) (pediatric): Secondary | ICD-10-CM | POA: Diagnosis not present

## 2020-03-16 DIAGNOSIS — G4733 Obstructive sleep apnea (adult) (pediatric): Secondary | ICD-10-CM | POA: Diagnosis not present

## 2020-04-15 DIAGNOSIS — G4733 Obstructive sleep apnea (adult) (pediatric): Secondary | ICD-10-CM | POA: Diagnosis not present

## 2020-05-16 DIAGNOSIS — G4733 Obstructive sleep apnea (adult) (pediatric): Secondary | ICD-10-CM | POA: Diagnosis not present

## 2020-06-16 DIAGNOSIS — G4733 Obstructive sleep apnea (adult) (pediatric): Secondary | ICD-10-CM | POA: Diagnosis not present

## 2020-07-14 DIAGNOSIS — G4733 Obstructive sleep apnea (adult) (pediatric): Secondary | ICD-10-CM | POA: Diagnosis not present

## 2020-08-14 DIAGNOSIS — G4733 Obstructive sleep apnea (adult) (pediatric): Secondary | ICD-10-CM | POA: Diagnosis not present

## 2020-10-05 DIAGNOSIS — Z125 Encounter for screening for malignant neoplasm of prostate: Secondary | ICD-10-CM | POA: Diagnosis not present

## 2020-10-05 DIAGNOSIS — E785 Hyperlipidemia, unspecified: Secondary | ICD-10-CM | POA: Diagnosis not present

## 2020-10-12 DIAGNOSIS — I10 Essential (primary) hypertension: Secondary | ICD-10-CM | POA: Diagnosis not present

## 2020-10-12 DIAGNOSIS — Z0000 Encounter for general adult medical examination without abnormal findings: Secondary | ICD-10-CM | POA: Diagnosis not present

## 2020-10-12 DIAGNOSIS — R82998 Other abnormal findings in urine: Secondary | ICD-10-CM | POA: Diagnosis not present

## 2021-05-04 DIAGNOSIS — K219 Gastro-esophageal reflux disease without esophagitis: Secondary | ICD-10-CM | POA: Diagnosis not present

## 2021-05-04 DIAGNOSIS — Z1211 Encounter for screening for malignant neoplasm of colon: Secondary | ICD-10-CM | POA: Diagnosis not present

## 2021-05-04 DIAGNOSIS — I10 Essential (primary) hypertension: Secondary | ICD-10-CM | POA: Diagnosis not present

## 2021-05-04 DIAGNOSIS — K200 Eosinophilic esophagitis: Secondary | ICD-10-CM | POA: Diagnosis not present

## 2021-05-11 DIAGNOSIS — G4733 Obstructive sleep apnea (adult) (pediatric): Secondary | ICD-10-CM | POA: Diagnosis not present

## 2021-06-11 DIAGNOSIS — G4733 Obstructive sleep apnea (adult) (pediatric): Secondary | ICD-10-CM | POA: Diagnosis not present

## 2021-07-09 DIAGNOSIS — G4733 Obstructive sleep apnea (adult) (pediatric): Secondary | ICD-10-CM | POA: Diagnosis not present

## 2021-11-30 DIAGNOSIS — Z125 Encounter for screening for malignant neoplasm of prostate: Secondary | ICD-10-CM | POA: Diagnosis not present

## 2021-11-30 DIAGNOSIS — M109 Gout, unspecified: Secondary | ICD-10-CM | POA: Diagnosis not present

## 2021-11-30 DIAGNOSIS — I10 Essential (primary) hypertension: Secondary | ICD-10-CM | POA: Diagnosis not present

## 2021-12-07 DIAGNOSIS — Z1339 Encounter for screening examination for other mental health and behavioral disorders: Secondary | ICD-10-CM | POA: Diagnosis not present

## 2021-12-07 DIAGNOSIS — Z1331 Encounter for screening for depression: Secondary | ICD-10-CM | POA: Diagnosis not present

## 2021-12-07 DIAGNOSIS — E118 Type 2 diabetes mellitus with unspecified complications: Secondary | ICD-10-CM | POA: Diagnosis not present

## 2021-12-07 DIAGNOSIS — R82998 Other abnormal findings in urine: Secondary | ICD-10-CM | POA: Diagnosis not present

## 2021-12-07 DIAGNOSIS — Z0000 Encounter for general adult medical examination without abnormal findings: Secondary | ICD-10-CM | POA: Diagnosis not present

## 2021-12-20 DIAGNOSIS — G4733 Obstructive sleep apnea (adult) (pediatric): Secondary | ICD-10-CM | POA: Diagnosis not present

## 2022-04-27 DIAGNOSIS — D225 Melanocytic nevi of trunk: Secondary | ICD-10-CM | POA: Diagnosis not present

## 2022-04-27 DIAGNOSIS — L814 Other melanin hyperpigmentation: Secondary | ICD-10-CM | POA: Diagnosis not present

## 2022-04-27 DIAGNOSIS — L821 Other seborrheic keratosis: Secondary | ICD-10-CM | POA: Diagnosis not present

## 2023-01-02 DIAGNOSIS — M109 Gout, unspecified: Secondary | ICD-10-CM | POA: Diagnosis not present

## 2023-01-02 DIAGNOSIS — E118 Type 2 diabetes mellitus with unspecified complications: Secondary | ICD-10-CM | POA: Diagnosis not present

## 2023-01-02 DIAGNOSIS — R7989 Other specified abnormal findings of blood chemistry: Secondary | ICD-10-CM | POA: Diagnosis not present

## 2023-01-02 DIAGNOSIS — E785 Hyperlipidemia, unspecified: Secondary | ICD-10-CM | POA: Diagnosis not present

## 2023-01-02 DIAGNOSIS — Z125 Encounter for screening for malignant neoplasm of prostate: Secondary | ICD-10-CM | POA: Diagnosis not present

## 2023-01-09 DIAGNOSIS — E876 Hypokalemia: Secondary | ICD-10-CM | POA: Diagnosis not present

## 2023-01-09 DIAGNOSIS — Z1339 Encounter for screening examination for other mental health and behavioral disorders: Secondary | ICD-10-CM | POA: Diagnosis not present

## 2023-01-09 DIAGNOSIS — E118 Type 2 diabetes mellitus with unspecified complications: Secondary | ICD-10-CM | POA: Diagnosis not present

## 2023-01-09 DIAGNOSIS — R82998 Other abnormal findings in urine: Secondary | ICD-10-CM | POA: Diagnosis not present

## 2023-01-09 DIAGNOSIS — Z0000 Encounter for general adult medical examination without abnormal findings: Secondary | ICD-10-CM | POA: Diagnosis not present

## 2023-01-09 DIAGNOSIS — Z1331 Encounter for screening for depression: Secondary | ICD-10-CM | POA: Diagnosis not present

## 2023-04-09 DIAGNOSIS — L820 Inflamed seborrheic keratosis: Secondary | ICD-10-CM | POA: Diagnosis not present

## 2023-04-09 DIAGNOSIS — D225 Melanocytic nevi of trunk: Secondary | ICD-10-CM | POA: Diagnosis not present

## 2023-04-09 DIAGNOSIS — L821 Other seborrheic keratosis: Secondary | ICD-10-CM | POA: Diagnosis not present

## 2023-04-09 DIAGNOSIS — L814 Other melanin hyperpigmentation: Secondary | ICD-10-CM | POA: Diagnosis not present

## 2023-04-09 DIAGNOSIS — L538 Other specified erythematous conditions: Secondary | ICD-10-CM | POA: Diagnosis not present

## 2023-04-09 DIAGNOSIS — L918 Other hypertrophic disorders of the skin: Secondary | ICD-10-CM | POA: Diagnosis not present

## 2023-04-09 DIAGNOSIS — Z789 Other specified health status: Secondary | ICD-10-CM | POA: Diagnosis not present

## 2023-04-09 DIAGNOSIS — R208 Other disturbances of skin sensation: Secondary | ICD-10-CM | POA: Diagnosis not present

## 2023-05-12 DIAGNOSIS — G4733 Obstructive sleep apnea (adult) (pediatric): Secondary | ICD-10-CM | POA: Diagnosis not present

## 2023-06-12 DIAGNOSIS — G4733 Obstructive sleep apnea (adult) (pediatric): Secondary | ICD-10-CM | POA: Diagnosis not present

## 2023-07-10 DIAGNOSIS — G4733 Obstructive sleep apnea (adult) (pediatric): Secondary | ICD-10-CM | POA: Diagnosis not present

## 2023-07-23 DIAGNOSIS — E1122 Type 2 diabetes mellitus with diabetic chronic kidney disease: Secondary | ICD-10-CM | POA: Diagnosis not present

## 2023-07-23 DIAGNOSIS — E785 Hyperlipidemia, unspecified: Secondary | ICD-10-CM | POA: Diagnosis not present

## 2023-07-23 DIAGNOSIS — I129 Hypertensive chronic kidney disease with stage 1 through stage 4 chronic kidney disease, or unspecified chronic kidney disease: Secondary | ICD-10-CM | POA: Diagnosis not present

## 2023-07-23 DIAGNOSIS — E876 Hypokalemia: Secondary | ICD-10-CM | POA: Diagnosis not present

## 2023-07-23 DIAGNOSIS — N183 Chronic kidney disease, stage 3 unspecified: Secondary | ICD-10-CM | POA: Diagnosis not present

## 2023-08-01 DIAGNOSIS — Z1211 Encounter for screening for malignant neoplasm of colon: Secondary | ICD-10-CM | POA: Diagnosis not present

## 2023-08-01 DIAGNOSIS — K200 Eosinophilic esophagitis: Secondary | ICD-10-CM | POA: Diagnosis not present

## 2023-08-17 DIAGNOSIS — E118 Type 2 diabetes mellitus with unspecified complications: Secondary | ICD-10-CM | POA: Diagnosis not present

## 2023-08-17 DIAGNOSIS — E876 Hypokalemia: Secondary | ICD-10-CM | POA: Diagnosis not present

## 2023-08-28 DIAGNOSIS — D123 Benign neoplasm of transverse colon: Secondary | ICD-10-CM | POA: Diagnosis not present

## 2023-08-28 DIAGNOSIS — Z1211 Encounter for screening for malignant neoplasm of colon: Secondary | ICD-10-CM | POA: Diagnosis not present

## 2023-08-28 DIAGNOSIS — K5730 Diverticulosis of large intestine without perforation or abscess without bleeding: Secondary | ICD-10-CM | POA: Diagnosis not present

## 2023-08-28 DIAGNOSIS — K635 Polyp of colon: Secondary | ICD-10-CM | POA: Diagnosis not present

## 2023-09-03 ENCOUNTER — Ambulatory Visit (HOSPITAL_BASED_OUTPATIENT_CLINIC_OR_DEPARTMENT_OTHER): Payer: Self-pay | Admitting: Internal Medicine

## 2023-09-03 ENCOUNTER — Encounter (HOSPITAL_BASED_OUTPATIENT_CLINIC_OR_DEPARTMENT_OTHER): Payer: Self-pay | Admitting: Internal Medicine

## 2023-09-03 VITALS — BP 150/92 | HR 59 | Ht 67.0 in | Wt 199.0 lb

## 2023-09-03 DIAGNOSIS — Z8249 Family history of ischemic heart disease and other diseases of the circulatory system: Secondary | ICD-10-CM | POA: Diagnosis not present

## 2023-09-03 DIAGNOSIS — E785 Hyperlipidemia, unspecified: Secondary | ICD-10-CM

## 2023-09-03 DIAGNOSIS — R931 Abnormal findings on diagnostic imaging of heart and coronary circulation: Secondary | ICD-10-CM | POA: Diagnosis not present

## 2023-09-03 NOTE — Patient Instructions (Signed)
 Medication Instructions:  NO CHANGES  *If you need a refill on your cardiac medications before your next appointment, please call your pharmacy*  Lab Work: FASTING lab work -- NMR lipoprofile, LPa, ApoB, Oxidized LDL  If you have labs (blood work) drawn today and your tests are completely normal, you will receive your results only by: MyChart Message (if you have MyChart) OR A paper copy in the mail If you have any lab test that is abnormal or we need to change your treatment, we will call you to review the results.  Follow-Up: At [facility], you and your health needs are our priority.  As part of our continuing mission to provide you with exceptional heart care, our providers are all part of one team.  This team includes your primary Cardiologist (physician) and Advanced Practice Providers or APPs (Physician Assistants and Nurse Practitioners) who all work together to provide you with the care you need, when you need it.  Your next appointment:    AS NEEDED with Dr. Maximo Spar -- pending results and recommendations.   We recommend signing up for the patient portal called "MyChart".  Sign up information is provided on this After Visit Summary.  MyChart is used to connect with patients for Virtual Visits (Telemedicine).  Patients are able to view lab/test results, encounter notes, upcoming appointments, etc.  Non-urgent messages can be sent to your provider as well.   To learn more about what you can do with MyChart, go to [email or web address].

## 2023-09-03 NOTE — Progress Notes (Signed)
 LIPID CLINIC CONSULT NOTE  Chief Complaint:  Manage dyslipidemia, high calcium score  Primary Care Physician: Avva, Ravisankar, MD  Primary Cardiologist:  None  HPI:  Frank Watson is a 58 y.o. male who is being seen today for the evaluation of dysliipidemia at the request of Avva, Ravisankar, MD. This is a pleasant 58 year old male who works as an Hydrographic surveyor in [employer].  He has history of heart disease is family including both parents including his father who had an MI and died in his 52s and mother who had coronary bypass grafting died of other noncardiac reasons in her 16s.  Coronary calcium score through [facility] in 2016 which was 119 and recently had a repeat calcium score in March 2025 which was 577, 95th percentile for age in fact sex matched controls, including most significantly LAD calcification as well as some disease in the circumflex and the right coronary artery.  His most recent lipid profile showed a total LDL 178 with an LDL of 110.  He is not on any lipid-lowering therapies.  He tells me he had previously tried simvastatin but had side effects including myalgias with that but has not been on any other lipid-lowering therapies in the past.  He does have some statin hesitancy and some concerns based on his review online literature.  He is interested in advanced lipid testing.  PMHx:  Past Medical History:  Diagnosis Date   Coronary artery calcification    HTN (hypertension)    Hyperlipidemia     Past Surgical History:  Procedure Laterality Date   ESOPHAGOGASTRODUODENOSCOPY N/A 12/19/2013   Procedure: ESOPHAGOGASTRODUODENOSCOPY (EGD);  Surgeon: Almeda Aris, MD;  Location[facility] ENDOSCOPY;  Service: Endoscopy;  Laterality: N/A;    FAMHx:  Family History  Problem Relation Age of Onset   CAD Mother    Heart attack Father     SOCHx:   reports that he has never smoked. He has never used smokeless tobacco. No history on file for alcohol  use and drug  use.  ALLERGIES:  Not on File  ROS: Pertinent items noted in HPI and remainder of comprehensive ROS otherwise negative.  HOME MEDS: Current Outpatient Medications on File Prior to Visit  Medication Sig Dispense Refill   colchicine 0.6 MG tablet Take 0.6 mg by mouth daily as needed. PRN only     esomeprazole (NEXIUM) 40 MG capsule TAKE 1 CAPSULE BY MOUTH EVERY DAY     KLOR-CON M20 20 MEQ tablet      Olmesartan-amLODIPine-HCTZ 40-10-12.5 MG TABS Take 1 tablet by mouth daily.     sildenafil (VIAGRA) 25 MG tablet Take 25 mg by mouth as needed.     No current facility-administered medications on file prior to visit.    LABS/IMAGING: No results found for this or any previous visit (from the past 48 hours). No results found.  LIPID PANEL: No results found for: "CHOL", "TRIG", "HDL", "CHOLHDL", "VLDL", "LDLCALC", "LDLDIRECT"  WEIGHTS: Wt Readings from Last 3 Encounters:  09/03/23 199 lb (90.3 kg)  09/29/19 200 lb (90.7 kg)    VITALS: BP (!) 150/92 (BP Location: Left Arm, Patient Position: Sitting)   Pulse (!) 59   Ht 5\' 7"  (1.702 m)   Wt 199 lb (90.3 kg)   SpO2 98%   BMI 31.17 kg/m   EXAM: Deferred  EKG: Deferred  ASSESSMENT: Dyslipidemia goal LDL less than 70 Strong family history of premature onset coronary artery disease in both parents Abnormal CAC score 577, 95th  percentile (07/2023) Simvastatin intolerant  PLAN: 1.   Mr. Walworth is a strong family history of early onset heart disease and cholesterol remains above target.  He does have a very high calcium score putting him at high risk of future cardiovascular events.  He is not really statin intolerant and since that he can only not tolerate simvastatin.  He has not tried any other statin or lipid-lowering therapies.  He has been working recently on diet and lifestyle modifications.  Would like to see the result of these.  Will plan to repeat lipid testing with an NMR, LP(a), APO B and oxidized LDL at his request.  I  will contact him with these data and we will determine the next steps of therapy.  I think it would be worthwhile to try at least another statin on him.  Again if he did not tolerate that then he might be a candidate for PCSK9 inhibitor but we did explain the importance of statin therapy including the antioxidant properties and anti-inflammatory benefits of statin medications which not present in PCSK9 inhibitors.  I will contact him with those results and schedule follow-up accordingly.  Thanks again for the kind referral.  Hazle Lites, MD, [facility], FNLA, FACP  [place]  [facility]  Medical Director of the Advanced Lipid Disorders &  Cardiovascular Risk Reduction Clinic Diplomate of the American Board of Clinical Lipidology Attending Cardiologist  Direct Dial: [phone]  Fax: [phone]  Website:  [email or web address] 09/03/2023, 12:55 PM

## 2023-09-05 LAB — NMR, LIPOPROFILE
Cholesterol, Total: 203 mg/dL — ABNORMAL HIGH (ref 100–199)
HDL Particle Number: 25.6 umol/L — ABNORMAL LOW (ref >=30.5)
HDL-C: 38 mg/dL — ABNORMAL LOW (ref >39)
LDL Particle Number: 1637 nmol/L — ABNORMAL HIGH (ref <1000)
LDL Size: 20.4 nm — ABNORMAL LOW (ref >20.5)
LDL-C (NIH Calc): 142 mg/dL — ABNORMAL HIGH (ref 0–99)
LP-IR Score: 65 — ABNORMAL HIGH (ref <=45)
Small LDL Particle Number: 1100 nmol/L — ABNORMAL HIGH (ref <=527)
Triglycerides: 125 mg/dL (ref 0–149)

## 2023-09-05 LAB — APOLIPOPROTEIN B: Apolipoprotein B: 111 mg/dL — ABNORMAL HIGH (ref <90)

## 2023-09-05 LAB — OXIDIZED LDL: Oxidized LDL: 43 ng/mL (ref 10–170)

## 2023-09-05 LAB — LIPOPROTEIN A (LPA): Lipoprotein (a): 41.5 nmol/L (ref <75.0)

## 2023-09-10 ENCOUNTER — Other Ambulatory Visit: Payer: Self-pay

## 2023-09-10 DIAGNOSIS — E785 Hyperlipidemia, unspecified: Secondary | ICD-10-CM

## 2023-09-10 MED ORDER — ROSUVASTATIN CALCIUM 20 MG PO TABS: 20.0000 mg | ORAL_TABLET | Freq: Every day | ORAL | 2 refills | Status: AC

## 2023-11-30 DIAGNOSIS — G4733 Obstructive sleep apnea (adult) (pediatric): Secondary | ICD-10-CM | POA: Diagnosis not present

## 2023-12-13 ENCOUNTER — Other Ambulatory Visit (HOSPITAL_BASED_OUTPATIENT_CLINIC_OR_DEPARTMENT_OTHER): Payer: Self-pay | Admitting: *Deleted

## 2023-12-13 ENCOUNTER — Encounter (HOSPITAL_BASED_OUTPATIENT_CLINIC_OR_DEPARTMENT_OTHER): Payer: Self-pay | Admitting: *Deleted

## 2023-12-13 DIAGNOSIS — E785 Hyperlipidemia, unspecified: Secondary | ICD-10-CM

## 2023-12-13 DIAGNOSIS — R931 Abnormal findings on diagnostic imaging of heart and coronary circulation: Secondary | ICD-10-CM

## 2023-12-13 DIAGNOSIS — Z8249 Family history of ischemic heart disease and other diseases of the circulatory system: Secondary | ICD-10-CM

## 2023-12-19 ENCOUNTER — Encounter (HOSPITAL_BASED_OUTPATIENT_CLINIC_OR_DEPARTMENT_OTHER): Admitting: Nurse Practitioner

## 2023-12-24 ENCOUNTER — Institutional Professional Consult (permissible substitution) (HOSPITAL_BASED_OUTPATIENT_CLINIC_OR_DEPARTMENT_OTHER): Payer: Self-pay | Admitting: Internal Medicine

## 2024-01-10 DIAGNOSIS — Z1389 Encounter for screening for other disorder: Secondary | ICD-10-CM | POA: Diagnosis not present

## 2024-01-10 DIAGNOSIS — E118 Type 2 diabetes mellitus with unspecified complications: Secondary | ICD-10-CM | POA: Diagnosis not present

## 2024-01-10 DIAGNOSIS — Z1212 Encounter for screening for malignant neoplasm of rectum: Secondary | ICD-10-CM | POA: Diagnosis not present

## 2024-01-10 DIAGNOSIS — E785 Hyperlipidemia, unspecified: Secondary | ICD-10-CM | POA: Diagnosis not present

## 2024-01-10 DIAGNOSIS — M109 Gout, unspecified: Secondary | ICD-10-CM | POA: Diagnosis not present

## 2024-01-10 DIAGNOSIS — Z125 Encounter for screening for malignant neoplasm of prostate: Secondary | ICD-10-CM | POA: Diagnosis not present

## 2024-01-16 DIAGNOSIS — Z0000 Encounter for general adult medical examination without abnormal findings: Secondary | ICD-10-CM | POA: Diagnosis not present

## 2024-01-16 DIAGNOSIS — R82998 Other abnormal findings in urine: Secondary | ICD-10-CM | POA: Diagnosis not present

## 2024-01-16 DIAGNOSIS — Z1339 Encounter for screening examination for other mental health and behavioral disorders: Secondary | ICD-10-CM | POA: Diagnosis not present

## 2024-01-16 DIAGNOSIS — Z1331 Encounter for screening for depression: Secondary | ICD-10-CM | POA: Diagnosis not present

## 2024-01-16 DIAGNOSIS — E118 Type 2 diabetes mellitus with unspecified complications: Secondary | ICD-10-CM | POA: Diagnosis not present

## 2024-01-16 DIAGNOSIS — I129 Hypertensive chronic kidney disease with stage 1 through stage 4 chronic kidney disease, or unspecified chronic kidney disease: Secondary | ICD-10-CM | POA: Diagnosis not present

## 2024-04-07 DIAGNOSIS — D1801 Hemangioma of skin and subcutaneous tissue: Secondary | ICD-10-CM | POA: Diagnosis not present

## 2024-04-07 DIAGNOSIS — L814 Other melanin hyperpigmentation: Secondary | ICD-10-CM | POA: Diagnosis not present

## 2024-04-07 DIAGNOSIS — L821 Other seborrheic keratosis: Secondary | ICD-10-CM | POA: Diagnosis not present
